# Patient Record
Sex: Male | Born: 1975 | Race: White | Hispanic: No | Marital: Married | Smoking: Former smoker
Health system: Southern US, Community
[De-identification: ages and names within clinical notes are randomized; demographics above are authoritative.]

---

## 2014-11-03 ENCOUNTER — Encounter: Payer: Self-pay | Admitting: Family Medicine

## 2014-11-03 ENCOUNTER — Ambulatory Visit (INDEPENDENT_AMBULATORY_CARE_PROVIDER_SITE_OTHER): Payer: Managed Care, Other (non HMO) | Admitting: Family Medicine

## 2014-11-03 ENCOUNTER — Ambulatory Visit: Payer: Managed Care, Other (non HMO)

## 2014-11-03 VITALS — BP 140/90 | HR 70 | Ht 73.0 in | Wt 263.0 lb

## 2014-11-03 DIAGNOSIS — N469 Male infertility, unspecified: Secondary | ICD-10-CM | POA: Diagnosis not present

## 2014-11-03 DIAGNOSIS — S161XXA Strain of muscle, fascia and tendon at neck level, initial encounter: Secondary | ICD-10-CM | POA: Diagnosis not present

## 2014-11-03 MED ORDER — NAPROXEN 500 MG PO TABS: 500.0000 mg | ORAL_TABLET | Freq: Two times a day (BID) | ORAL | Status: DC

## 2014-11-03 MED ORDER — CYCLOBENZAPRINE HCL 10 MG PO TABS: 10.0000 mg | ORAL_TABLET | Freq: Three times a day (TID) | ORAL | Status: DC | PRN

## 2014-11-03 NOTE — Assessment & Plan Note (Signed)
Status post vasectomy. Referred to Dr. Rolan Lipa at [facility]

## 2014-11-03 NOTE — Patient Instructions (Signed)
Thank you for coming in today. Return in a few weeks to follow up neck and have a wellness visit.  Return fasting.  We will recheck blood pressure then.   Come back or go to the emergency room if you notice new weakness new numbness problems walking or bowel or bladder problems.

## 2014-11-03 NOTE — Progress Notes (Signed)
Barry Russell is a 39 y.o. male who presents to [name]: Primary Care  today for neck and back pain. Patient has a three-week history of pain in the posterior neck radiating to the upper thoracic back. Pain is worse with neck motion and with arm motion. He denies any radiating pain to his hands weakness or numbness fevers chills nausea vomiting or diarrhea. He's tried some aspirin which helps. Patient is here to establish care. He denies any personal past medical history. He has a history of a vasectomy and was looking for vasectomy reversal if possible to have a child.   History reviewed. No pertinent past medical history. History reviewed. No pertinent past surgical history. Social History  Substance Use Topics  . Smoking status: Former Games developer  . Smokeless tobacco: Not on file  . Alcohol Use: 0.0 oz/week    0 Standard drinks or equivalent per week   family history is not on file.  ROS as above Medications: Current Outpatient Prescriptions  Medication Sig Dispense Refill  . cyclobenzaprine (FLEXERIL) 10 MG tablet Take 1 tablet (10 mg total) by mouth 3 (three) times daily as needed for muscle spasms. 30 tablet 0  . naproxen (NAPROSYN) 500 MG tablet Take 1 tablet (500 mg total) by mouth 2 (two) times daily with a meal. 30 tablet 0   No current facility-administered medications for this visit.   No Known Allergies   Exam:  BP 140/90 mmHg  Pulse 70  Ht  (1.854 m)  Wt 263 lb (119.296 kg)  BMI 34.71 kg/m2 Gen: Well NAD HEENT: EOMI,  MMM Lungs: Normal work of breathing. CTABL Heart: RRR no MRG Abd: NABS, Soft. Nondistended, Nontender Exts: Brisk capillary refill, warm and well perfused.  Neck: Nontender to midline normal neck range of motion negative Spurling's test. Upper extremity strength is equal and normal throughout. Reflexes are intact and equal and normal bilateral upper and lower extremities. The thoracic spine is nontender to midline but  mildly tender to palpation bilateral rhomboids. Pain with scapular motion.  No results found for this or any previous visit (from the past 24 hour(s)). No results found.   Please see individual assessment and plan sections.

## 2014-11-03 NOTE — Assessment & Plan Note (Signed)
Pain consistent with myofascial strain of the cervical spine and the rhomboids. Treat with naproxen Flexeril and referral to physical therapy. C-spine x-ray pending. Return in a few weeks. Recheck blood pressure at that time.

## 2014-11-20 ENCOUNTER — Ambulatory Visit (INDEPENDENT_AMBULATORY_CARE_PROVIDER_SITE_OTHER): Payer: Managed Care, Other (non HMO)

## 2014-11-20 ENCOUNTER — Ambulatory Visit (INDEPENDENT_AMBULATORY_CARE_PROVIDER_SITE_OTHER): Payer: Managed Care, Other (non HMO) | Admitting: Physical Therapy

## 2014-11-20 DIAGNOSIS — M546 Pain in thoracic spine: Secondary | ICD-10-CM

## 2014-11-20 DIAGNOSIS — R531 Weakness: Secondary | ICD-10-CM | POA: Diagnosis not present

## 2014-11-20 DIAGNOSIS — R6889 Other general symptoms and signs: Secondary | ICD-10-CM | POA: Diagnosis not present

## 2014-11-20 DIAGNOSIS — M5032 Other cervical disc degeneration, mid-cervical region: Secondary | ICD-10-CM | POA: Diagnosis not present

## 2014-11-20 DIAGNOSIS — M2560 Stiffness of unspecified joint, not elsewhere classified: Secondary | ICD-10-CM

## 2014-11-20 NOTE — Patient Instructions (Signed)
  Flexibility: Upper Trapezius Stretch   Gently grasp right side of head while reaching behind back with other hand. Tilt head away until a gentle stretch is felt. Hold 30 seconds. Repeat 3 times per set. Do 2 sessions per day.  http://orth.exer.us/340   Levator Stretch   Grasp seat or sit on hand on side to be stretched. Turn head toward other side and look down. Use hand on head to gently stretch neck in that position. Hold _30___ seconds. Repeat on other side. Repeat 3 times. Do 2 sessions per day.  http://[email or web address].exer.us/30   Scapular Retraction (Standing)   With arms at sides, pinch shoulder blades together. Repeat 10 times per set. Do 1-3 sets per session. Do 2 sessions per day.  http://orth.exer.us/944    Posture - Sitting   Sit upright, head facing forward. Try using a roll to support lower back. Keep shoulders relaxed, and avoid rounded back. Keep hips level with knees. Avoid crossing legs for long periods.   Flexibility: Corner Stretch   Standing in corner or a doorway with hands just above shoulder level.  Lean forward until a comfortable stretch is felt across chest. Hold __30__ seconds. Repeat __3__ times per set.  Do _2___ sessions per day.  http://orth.exer.us/342   Copyright  VHI. All rights reserved.   Thoracic Self-Mobilization (Sitting)   With small rolled towel at lower ribs level, gently lean back until stretch is felt. Hold 15-20 seconds. Relax. Repeat _3___ times per set. Do ____ sets per session. Do _2___ sessions per day.  http://orth.exer.us/998   Copyright  VHI. All rights reserved.  Thoracic Self-Mobilization Stretch (Supine)   With small rolled towel at lower ribs level, gently lie back until stretch is felt. Hold _1-5 minutes. Relax. Repeat at different levels along your spine.  Solon Palm, PT 11/20/2014 10:12 AM;  [facility] at [facility] [address] [place], [place]  [address]  [phone] (office) [phone] (fax)

## 2014-11-20 NOTE — Therapy (Addendum)
[place], [place], [address] Phone: [phone]   Fax:  [phone]  Physical Therapy Evaluation  Patient Details  Name: Barry Russell MRN: [id number] Date of Birth: Dec 03, 1975 Referring Ailee Pates:  Gregor Hams, MD  Encounter Date: 11/20/2014      PT End of Session - 11/20/14 0936    Visit Number 1   Number of Visits 12   Date for PT Re-Evaluation 01/01/15   PT Start Time 0936   PT Stop Time 1014   PT Time Calculation (min) 38 min   Activity Tolerance Patient tolerated treatment well   Behavior During Therapy [facility] for tasks assessed/performed      No past medical history on file.  No past surgical history on file.  There were no vitals filed for this visit.  Visit Diagnosis:  Thoracic spine pain - Plan: PT plan of care cert/re-cert  Stiffness of multiple joints - Plan: PT plan of care cert/re-cert  Activity intolerance - Plan: PT plan of care cert/re-cert  Generalized weakness - Plan: PT plan of care cert/re-cert      Subjective Assessment - 11/20/14 0938    Subjective Patient has had pain in between his shoulder blades beginning just ove a month ago. He awoke with back ache. When he is idle it comes. When he's working it goes away by 10 or 11 oclock in the morning. Patient also reports HA accompanies the pain.   Limitations Other (comment)  limits any position for prolonged period   Patient Stated Goals to get rid of his pain   Currently in Pain? Yes   Pain Score 7    Pain Location Back   Pain Orientation Left;Mid;Right  alternates sides   Pain Descriptors / Indicators Aching;Throbbing;Pressure   Pain Type Acute pain   Pain Onset More than a month ago   Pain Frequency Intermittent   Aggravating Factors  prolonged positioning   Pain Relieving Factors tries to move around or stretch   Effect of Pain on Daily Activities affecting sleep, ADLS            [facility] PT  Assessment - 11/20/14 0001    Assessment   Medical Diagnosis cervical strain   Onset Date/Surgical Date 10/20/14   Hand Dominance Right   Next MD Visit 11/23/14   Prior Therapy none   Precautions   Precautions None   Balance Screen   Has the patient fallen in the past 6 months No   Has the patient had a decrease in activity level because of a fear of falling?  No   Is the patient reluctant to leave their home because of a fear of falling?  No   Prior Function   Level of Independence Independent   Vocation Full time employment   Agricultural engineer   Observation/Other Assessments   Focus on Therapeutic Outcomes (FOTO)  39% limited   ROM / Strength   AROM / PROM / Strength AROM;Strength   AROM   AROM Assessment Site Cervical   Cervical - Right Side Bend 32   Cervical - Left Side Bend 38   Cervical - Right Rotation 65   Cervical - Left Rotation 62   Strength   Overall Strength Comments mid/low traps 4-4+/5 bil, else shoulders 5/5   Strength Assessment Site --   Palpation   Spinal mobility T4/5 marked pain and stiffness L>R; T5-T7 stiff bil with some pain.   Palpation comment unremarkable   Special  Tests    Special Tests --  neg cervical special tests                           PT Education - 11/20/14 1350    Education provided Yes   Education Details HEP: cerv tension stretches, postural ed, scapular retraction, thoracic extension over roll and seated over chair   Person(s) Educated Patient   Methods Explanation;Demonstration;Tactile cues;Verbal cues;Handout   Comprehension Verbalized understanding;Returned demonstration             PT Long Term Goals - 11/20/14 1356    PT LONG TERM GOAL #1   Title I with advanced HEP   Time 6   Period Weeks   Status New   PT LONG TERM GOAL #2   Title No pain in the upper back with ADLS   Time 6   Period Weeks   Status New   PT LONG TERM GOAL #3   Title Patient able to sleep without waking  from pain.   Time 6   Period Weeks   Status New   PT LONG TERM GOAL #4   Title Thoracic spine mobility WFL.   Time 6   Period Weeks   Status New               Plan - 11/20/14 1351    Clinical Impression Statement Pt is a 39 year old male with acute onset of mid thoracic pain beginning just over a month ago. He has marked stiffness of T4/5 adjacent vertebrae and pain with PA mobs referring pain to neck on left side. He has poor posture and upper back weakness as well. He also has mild cervical ROM deficits due to tight muscles.   Pt will benefit from skilled therapeutic intervention in order to improve on the following deficits Decreased range of motion;Decreased activity tolerance;Pain;Hypomobility;Postural dysfunction;Decreased strength   Rehab Potential Excellent   PT Frequency 2x / week   PT Duration 6 weeks   PT Treatment/Interventions ADLs/Self Care Home Management;Cryotherapy;Electrical Stimulation;Moist Heat;Therapeutic exercise;Ultrasound;Traction;Neuromuscular re-education;Patient/family education;Manual techniques;Taping;Dry needling;Passive range of motion   PT Next Visit Plan Thoracic  mobs and extension, upper back strengthening, modalities prn   Consulted and Agree with Plan of Care Patient         Problem List Patient Active Problem List   Diagnosis Date Noted  . Cervical strain 11/03/2014  . 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 He failed to show for additional scheduled appt.    Remaining deficits: Unknown   Education / Equipment: HEP  Plan: Patient agrees to discharge.  Patient goals were  not met. Patient is being discharged due to not returning since the last visit.  ?????    Celyn P. Helene Kelp PT, MPH 12/23/2014 1:07 PM

## 2014-11-20 NOTE — Progress Notes (Signed)
Quick Note:  Xray shows mild arthritis ______ 

## 2014-11-23 ENCOUNTER — Ambulatory Visit (INDEPENDENT_AMBULATORY_CARE_PROVIDER_SITE_OTHER): Payer: Managed Care, Other (non HMO) | Admitting: Family Medicine

## 2014-11-23 ENCOUNTER — Encounter: Payer: Self-pay | Admitting: Family Medicine

## 2014-11-23 VITALS — BP 151/98 | HR 79 | Ht 73.0 in | Wt 258.0 lb

## 2014-11-23 DIAGNOSIS — Z0000 Encounter for general adult medical examination without abnormal findings: Secondary | ICD-10-CM | POA: Diagnosis not present

## 2014-11-23 DIAGNOSIS — IMO0001 Reserved for inherently not codable concepts without codable children: Secondary | ICD-10-CM

## 2014-11-23 DIAGNOSIS — S161XXA Strain of muscle, fascia and tendon at neck level, initial encounter: Secondary | ICD-10-CM

## 2014-11-23 MED ORDER — CYCLOBENZAPRINE HCL 10 MG PO TABS: 10.0000 mg | ORAL_TABLET | Freq: Three times a day (TID) | ORAL | Status: AC | PRN

## 2014-11-23 MED ORDER — NAPROXEN 500 MG PO TABS: 500.0000 mg | ORAL_TABLET | Freq: Two times a day (BID) | ORAL | Status: AC

## 2014-11-23 NOTE — Patient Instructions (Addendum)
Thank you for coming in today. Get labs today.  Work on weight loss.  Follow up with Dr. Humberto [facility] [address] [facility], [address] [place], [place] [phone]  If you cannot reduce your weight and therefore blood pressure in 6 months we will start medicine.   Call or go to the emergency room if you get worse, have trouble breathing, have chest pains, or palpitations.    Hypertension Hypertension, commonly called high blood pressure, is when the force of blood pumping through your arteries is too strong. Your arteries are the blood vessels that carry blood from your heart throughout your body. A blood pressure reading consists of a higher number over a lower number, such as 110/72. The higher number (systolic) is the pressure inside your arteries when your heart pumps. The lower number (diastolic) is the pressure inside your arteries when your heart relaxes. Ideally you want your blood pressure below 120/80. Hypertension forces your heart to work harder to pump blood. Your arteries may become narrow or stiff. Having hypertension puts you at risk for heart disease, stroke, and other problems.  RISK FACTORS Some risk factors for high blood pressure are controllable. Others are not.  Risk factors you cannot control include:   Race. You may be at higher risk if you are African American.  Age. Risk increases with age.  Gender. Men are at higher risk than women before age 51 years. After age 69, women are at higher risk than men. Risk factors you can control include:  Not getting enough exercise or physical activity.  Being overweight.  Getting too much fat, sugar, calories, or salt in your diet.  Drinking too much alcohol. SIGNS AND SYMPTOMS Hypertension does not usually cause signs or symptoms. Extremely high blood pressure (hypertensive crisis) may cause headache, anxiety, shortness of breath, and nosebleed. DIAGNOSIS  To check if you have hypertension, your health care  Paolo Okane will measure your blood pressure while you are seated, with your arm held at the level of your heart. It should be measured at least twice using the same arm. Certain conditions can cause a difference in blood pressure between your right and left arms. A blood pressure reading that is higher than normal on one occasion does not mean that you need treatment. If one blood pressure reading is high, ask your health care Frederich Montilla about having it checked again. TREATMENT  Treating high blood pressure includes making lifestyle changes and possibly taking medicine. Living a healthy lifestyle can help lower high blood pressure. You may need to change some of your habits. Lifestyle changes may include:  Following the DASH diet. This diet is high in fruits, vegetables, and whole grains. It is low in salt, red meat, and added sugars.  Getting at least 2 hours of brisk physical activity every week.  Losing weight if necessary.  Not smoking.  Limiting alcoholic beverages.  Learning ways to reduce stress. If lifestyle changes are not enough to get your blood pressure under control, your health care Malena Timpone may prescribe medicine. You may need to take more than one. Work closely with your health care Latishia Suitt to understand the risks and benefits. HOME CARE INSTRUCTIONS  Have your blood pressure rechecked as directed by your health care Johnie Makki.   Take medicines only as directed by your health care Julieth Tugman. Follow the directions carefully. Blood pressure medicines must be taken as prescribed. The medicine does not work as well when you skip doses. Skipping doses also puts you at  risk for problems.   Do not smoke.   Monitor your blood pressure at home as directed by your health care Gaye Scorza. SEEK MEDICAL CARE IF:   You think you are having a reaction to medicines taken.  You have recurrent headaches or feel dizzy.  You have swelling in your ankles.  You have trouble with your  vision. SEEK IMMEDIATE MEDICAL CARE IF:  You develop a severe headache or confusion.  You have unusual weakness, numbness, or feel faint.  You have severe chest or abdominal pain.  You vomit repeatedly.  You have trouble breathing. MAKE SURE YOU:   Understand these instructions.  Will watch your condition.  Will get help right away if you are not doing well or get worse. Document Released: 02/13/2005 Document Revised: 06/30/2013 Document Reviewed: 12/06/2012 [facility] Patient Information 2015 [place], [place]. This information is not intended to replace advice given to you by your health care Valli Randol. Make sure you discuss any questions you have with your health care Tiffany Talarico. w

## 2014-11-23 NOTE — Assessment & Plan Note (Signed)
Check a metabolic panel CBC lipid panel and TSH. Work on weight loss. Return in 6 months. I recommend patient takes blood pressure medications but he declines this at this time wishing to work on weight loss instead.

## 2014-11-23 NOTE — Progress Notes (Signed)
Barry Russell is a 39 y.o. male who presents to [facility]: Primary Care  today for  Wellness visit. Patient was seen recently for nec pain. He was thought to have an cervical neck muscle spasm. X-ray showed mild DDD.  He take naproxen and flexeril intermittently and would like a refill today. Additionally he notes that he has an appointment with Dr. Rolan Russell to discuss fertility options on Wednesday.    Patient notes continued elevated blood pressure. He denies any chest pains or palpitations. He denies any history of hypertension. He is not interested in taking medicines today. He would like to work on weight loss instead.  This patient feels well with no complaints. He is not interested in flu or tetanus vaccines today.   No past medical history on file. No past surgical history on file. Social History  Substance Use Topics  . Smoking status: Former Games developer  . Smokeless tobacco: Not on file  . Alcohol Use: 0.0 oz/week    0 Standard drinks or equivalent per week   family history is not on file.  ROS as above Medications: Current Outpatient Prescriptions  Medication Sig Dispense Refill  . cyclobenzaprine (FLEXERIL) 10 MG tablet Take 1 tablet (10 mg total) by mouth 3 (three) times daily as needed for muscle spasms. 30 tablet 1  . naproxen (NAPROSYN) 500 MG tablet Take 1 tablet (500 mg total) by mouth 2 (two) times daily with a meal. 30 tablet 1   No current facility-administered medications for this visit.   No Known Allergies   Exam:  BP 151/98 mmHg  Pulse 79  Ht  (1.854 m)  Wt 258 lb (117.028 kg)  BMI 34.05 kg/m2 Gen: Well NAD obese HEENT: EOMI,  MMM Lungs: Normal work of breathing. CTABL Heart: RRR no MRG Abd: NABS, Soft. Nondistended, Nontender Exts: Brisk capillary refill, warm and well perfused.   No results found for this or any previous visit (from the past 24 hour(s)). No results found.   Please see individual assessment and plan  sections.

## 2014-11-27 ENCOUNTER — Encounter: Payer: Managed Care, Other (non HMO) | Admitting: Rehabilitative and Restorative Service Providers"

## 2015-05-21 ENCOUNTER — Ambulatory Visit (INDEPENDENT_AMBULATORY_CARE_PROVIDER_SITE_OTHER): Payer: Managed Care, Other (non HMO) | Admitting: Family Medicine

## 2015-05-21 DIAGNOSIS — Z5329 Procedure and treatment not carried out because of patient's decision for other reasons: Secondary | ICD-10-CM

## 2015-05-21 NOTE — Progress Notes (Signed)
No show

## 2016-07-01 IMAGING — CR DG CERVICAL SPINE COMPLETE 4+V
8 series · 8 of 8 positions shown · non-contrast
Comparison: None.

CLINICAL DATA: One month history of neck pain and interscapular
pain. No known injuries.

EXAM:
CERVICAL SPINE  4+ VIEWS

[c-spine lat]
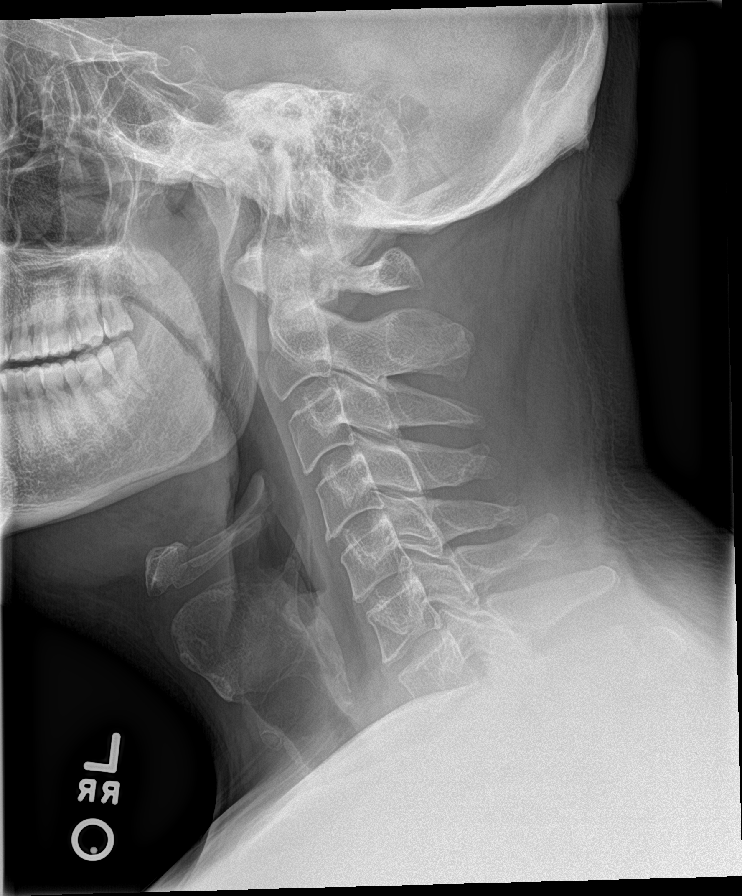

[c-spine obl (1 of 3)]
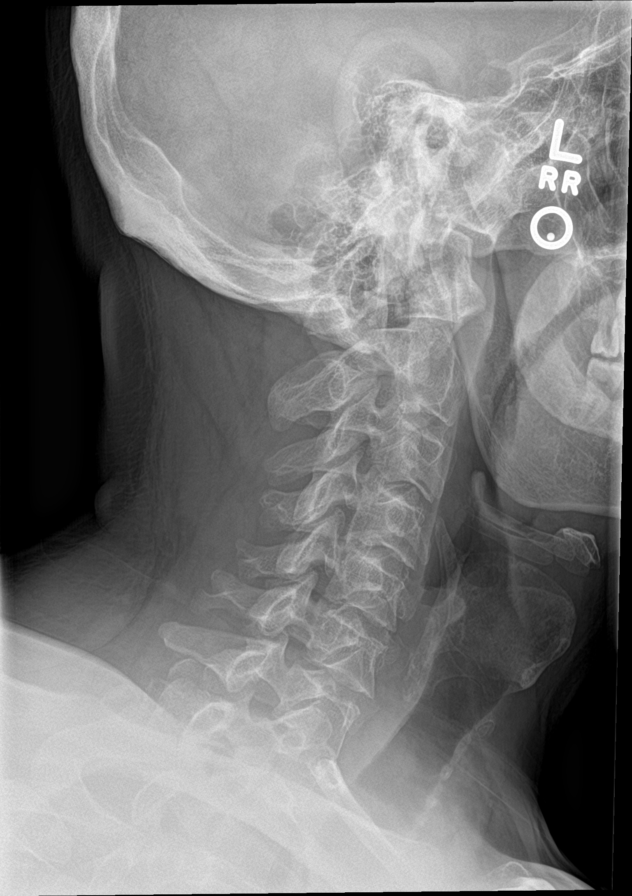

[c-spine obl (2 of 3)]
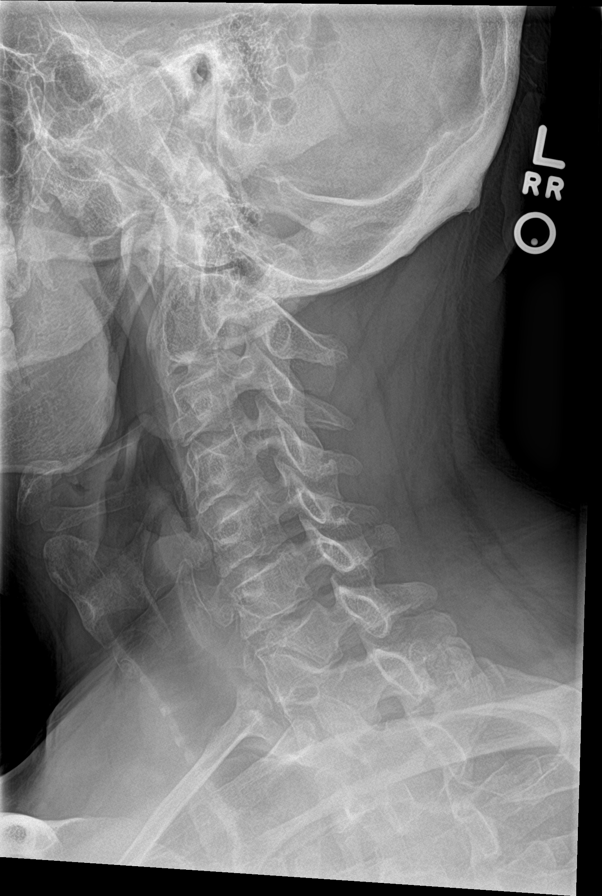

[c-spine ap]
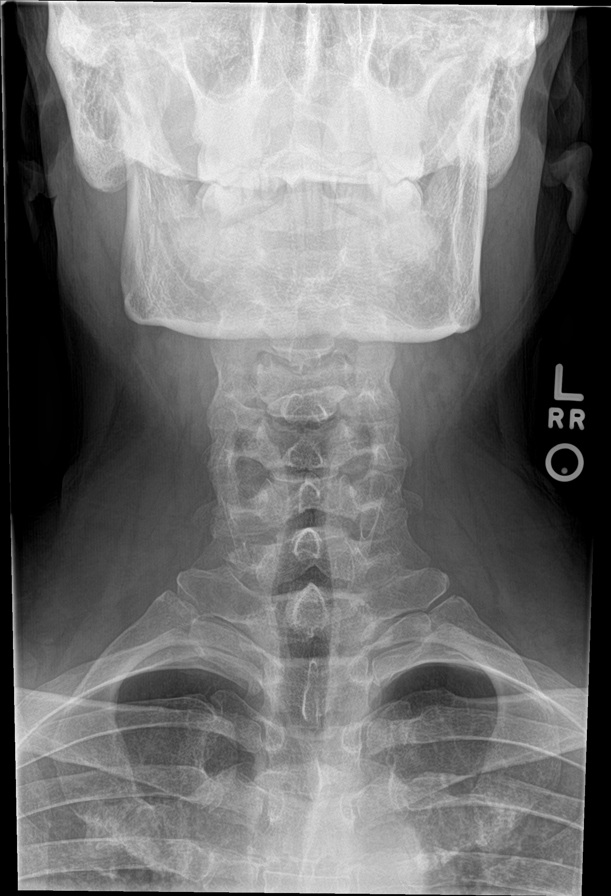

[c-spine open mouth (1 of 2)]
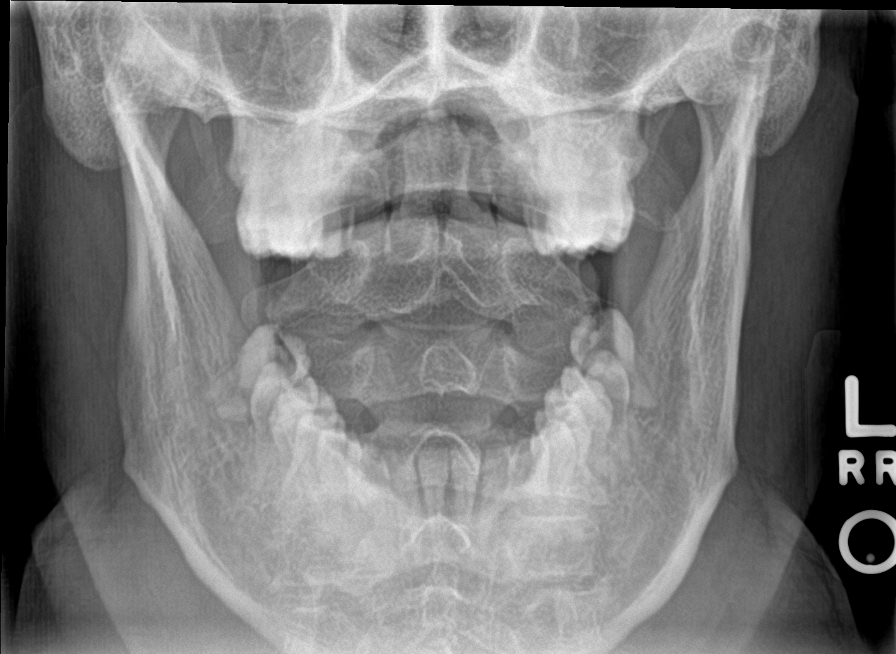

[c-spine swimmers]
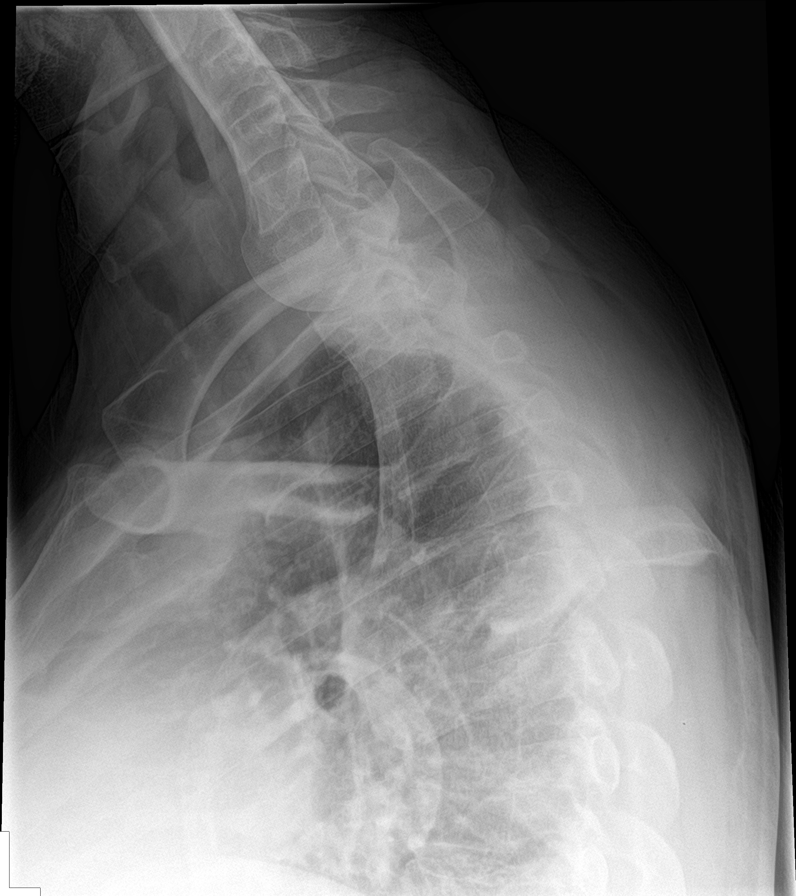

[c-spine obl (3 of 3)]
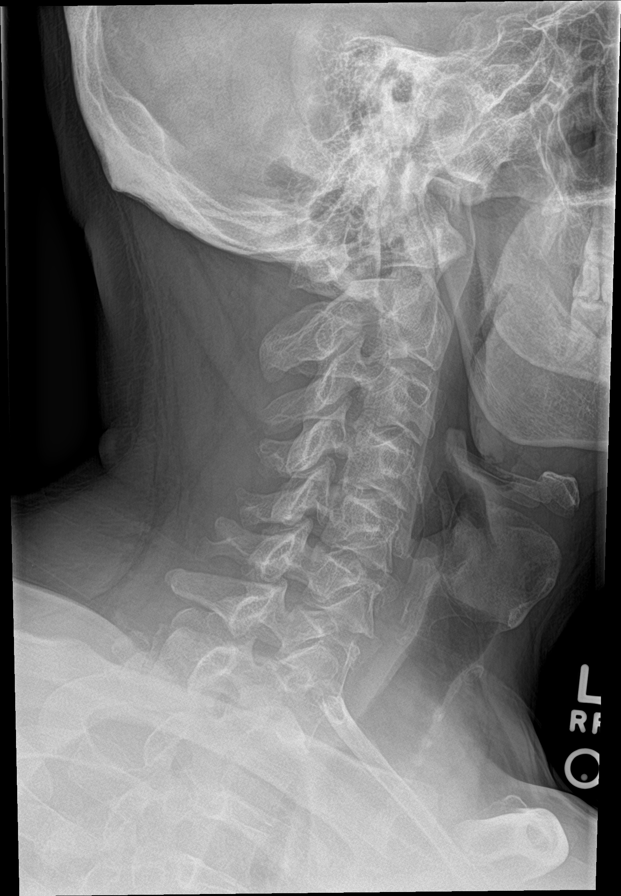

[c-spine open mouth (2 of 2)]
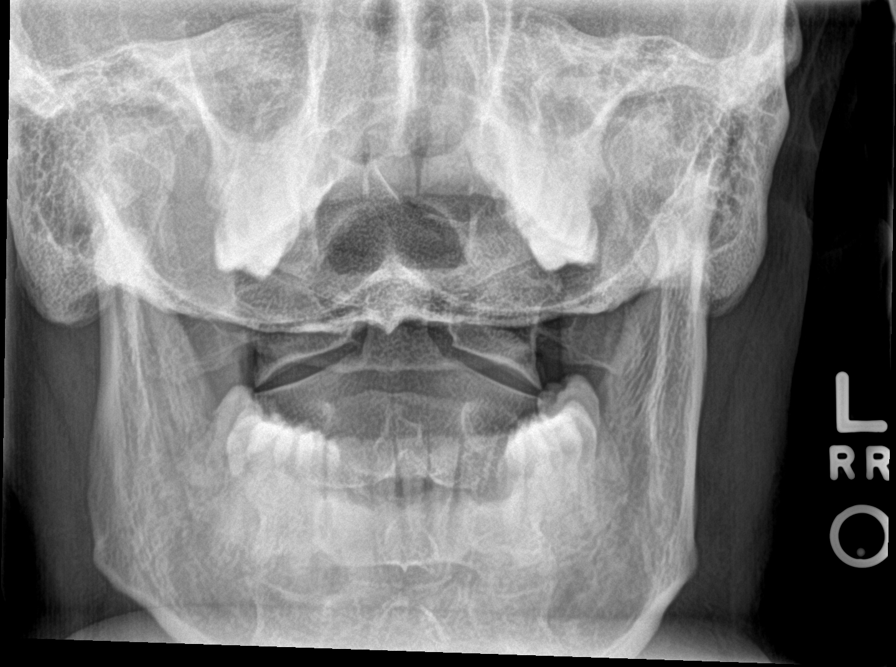

[8 of 8 positions shown; findings below may reference images not displayed]

FINDINGS: Straightening of the usual cervical lordosis. Anatomic alignment. No
visible fractures. Normal prevertebral soft tissues. Mild disc space
narrowing at C5-6. Remaining disc spaces well preserved. Facet
joints intact. No visible bony foraminal stenoses. No static
evidence of instability.
IMPRESSION: 1. Straightening of the usual lordosis which may reflect positioning
and/or spasm.
2. Mild degenerative disc disease at C5-6. Remaining disc spaces
well preserved.
